# Patient Record
Sex: Male | Born: 1990 | Hispanic: Yes | Marital: Single | Smoking: Former smoker
Health system: Southern US, Community
[De-identification: ages and names within clinical notes are randomized; demographics above are authoritative.]

## PROBLEM LIST (undated history)

## (undated) DIAGNOSIS — R51 Headache: Secondary | ICD-10-CM

## (undated) DIAGNOSIS — K219 Gastro-esophageal reflux disease without esophagitis: Secondary | ICD-10-CM

## (undated) DIAGNOSIS — R040 Epistaxis: Secondary | ICD-10-CM

## (undated) DIAGNOSIS — J302 Other seasonal allergic rhinitis: Secondary | ICD-10-CM

---

## 2009-01-13 ENCOUNTER — Emergency Department (HOSPITAL_COMMUNITY): Admission: EM | Admit: 2009-01-13 | Discharge: 2009-01-13 | Payer: Self-pay | Admitting: Emergency Medicine

## 2011-01-29 ENCOUNTER — Other Ambulatory Visit: Payer: Self-pay | Admitting: Chiropractic Medicine

## 2011-01-29 DIAGNOSIS — M25562 Pain in left knee: Secondary | ICD-10-CM

## 2011-02-03 ENCOUNTER — Ambulatory Visit
Admission: RE | Admit: 2011-02-03 | Discharge: 2011-02-03 | Disposition: A | Discharge status: Home / Self Care | Payer: Self-pay | Source: Ambulatory Visit | Attending: Chiropractic Medicine | Admitting: Chiropractic Medicine

## 2011-02-03 DIAGNOSIS — M25562 Pain in left knee: Secondary | ICD-10-CM

## 2011-09-15 ENCOUNTER — Encounter (HOSPITAL_COMMUNITY)
Admission: RE | Admit: 2011-09-15 | Discharge: 2011-09-15 | Disposition: A | Discharge status: Home / Self Care | Payer: Self-pay | Source: Ambulatory Visit | Attending: Orthopedic Surgery | Admitting: Orthopedic Surgery

## 2011-09-15 ENCOUNTER — Encounter (HOSPITAL_COMMUNITY): Payer: Self-pay

## 2011-09-15 ENCOUNTER — Encounter (HOSPITAL_COMMUNITY): Payer: Self-pay | Admitting: Pharmacy Technician

## 2011-09-15 HISTORY — DX: Epistaxis: R04.0

## 2011-09-15 HISTORY — DX: Headache: R51

## 2011-09-15 HISTORY — DX: Other seasonal allergic rhinitis: J30.2

## 2011-09-15 HISTORY — DX: Gastro-esophageal reflux disease without esophagitis: K21.9

## 2011-09-15 LAB — BASIC METABOLIC PANEL
BUN: 17 mg/dL (ref 6–23)
CO2: 23 mEq/L (ref 19–32)
Calcium: 9.7 mg/dL (ref 8.4–10.5)
Chloride: 104 mEq/L (ref 96–112)
Creatinine, Ser: 0.6 mg/dL (ref 0.50–1.35)
GFR calc Af Amer: >90 mL/min (ref >90)

## 2011-09-15 LAB — CBC
HCT: 49.4 % (ref 39.0–52.0)
MCHC: 34.8 g/dL (ref 30.0–36.0)
MCV: 92.2 fL (ref 78.0–100.0)
Platelets: 197 10*3/uL (ref 150–400)
RDW: 12.4 % (ref 11.5–15.5)

## 2011-09-15 LAB — SURGICAL PCR SCREEN: MRSA, PCR: NEGATIVE

## 2011-09-15 NOTE — Progress Notes (Signed)
Left msg. At Dr. Ranell Patrick office to ask for re-order on OR consent for correct side- pt. Reports its the L knee

## 2011-09-15 NOTE — Pre-Procedure Instructions (Signed)
18 Christopher Dixon  09/15/2011   Your procedure is scheduled on:  09/25/2011  Report to [facility] at 5:30 AM.  Call this number if you have problems the morning of surgery: [phone]   Remember:   Do not eat food:After Midnight.  THURSDAY  May have clear liquids: up to 4 Hours before arrival.  NOTHING AFTER 1:30a.m.   Clear liquids include soda, tea, black coffee, apple or grape juice, broth.  Take these medicines the morning of surgery with A SIP OF WATER: NONE   Do not wear jewelry, make-up or nail polish.  Do not wear lotions, powders, or perfumes. You may wear deodorant.  Do not shave 48 hours prior to surgery. Men may shave face and neck.  Do not bring valuables to the hospital.  Contacts, dentures or bridgework may not be worn into surgery.  Leave suitcase in the car. After surgery it may be brought to your room.  For patients admitted to the hospital, checkout time is 11:00 AM the day of discharge.   Patients discharged the day of surgery will not be allowed to drive home.  Name and phone number of your driver: /w FRIEND  Special Instructions: CHG Shower Use Special Wash: 1/2 bottle night before surgery and 1/2 bottle morning of surgery.   Please read over the following fact sheets that you were given: Pain Booklet, Coughing and Deep Breathing, MRSA Information and Surgical Site Infection Prevention

## 2011-09-22 NOTE — H&P (Addendum)
CC: right knee pain HPI: 21 y/o male with a knee injury with complete ACL rupture, pt has elected for an ACL repair to increase function for the right knee PMH: GERD Social: non smoker, no ETOH, no PCP Allergies: NKDA Meds: tylenol motrin ROS: pain and instability of right knee with rom and activity Vitals: alert and appropriate 21 y/o male in no acute distress Right knee with full rom Positive lachmans and anterior drawer tests nv intact with no rashes or edema distally Normal heel toe gait X-rays: complete rupture of right acl Impression: right ACL tear Plan: acl reconstruction with hamstring autograft  The patient has a left knee ACL tear.  The history and physical on Sep 25, 2011 reveals and injured and unstable left knee.  Imaging reveals Left knee ACL tear and displaced bucket-handle medial meniscus tear.  PLan L knee ACL-R and possible meniscal repair  Malon Kindle, MD

## 2011-09-24 MED ORDER — CEFAZOLIN SODIUM 1-5 GM-% IV SOLN
1.0000 g | INTRAVENOUS | Status: AC
  Administered 2011-09-25 (×2): 1 g via INTRAVENOUS
  Filled 2011-09-24: qty 50

## 2011-09-24 MED ORDER — CHLORHEXIDINE GLUCONATE 4 % EX LIQD: 60.0000 mL | Freq: Once | CUTANEOUS | Status: DC

## 2011-09-24 MED ORDER — SODIUM CHLORIDE 0.9 % IV SOLN: INTRAVENOUS | Status: DC

## 2011-09-25 ENCOUNTER — Encounter (HOSPITAL_COMMUNITY): Payer: Self-pay | Admitting: Surgery

## 2011-09-25 ENCOUNTER — Encounter (HOSPITAL_COMMUNITY)
Admission: RE | Disposition: A | Discharge status: Home / Self Care | Payer: Self-pay | Source: Ambulatory Visit | Attending: Orthopedic Surgery

## 2011-09-25 ENCOUNTER — Encounter (HOSPITAL_COMMUNITY): Payer: Self-pay | Admitting: Certified Registered"

## 2011-09-25 ENCOUNTER — Ambulatory Visit (HOSPITAL_COMMUNITY): Payer: Self-pay | Admitting: Certified Registered"

## 2011-09-25 ENCOUNTER — Ambulatory Visit (HOSPITAL_COMMUNITY)
Admission: RE | Admit: 2011-09-25 | Discharge: 2011-09-25 | Disposition: A | Discharge status: Home / Self Care | Payer: Self-pay | Source: Ambulatory Visit | Attending: Orthopedic Surgery | Admitting: Orthopedic Surgery

## 2011-09-25 DIAGNOSIS — IMO0002 Reserved for concepts with insufficient information to code with codable children: Secondary | ICD-10-CM | POA: Insufficient documentation

## 2011-09-25 DIAGNOSIS — S83509A Sprain of unspecified cruciate ligament of unspecified knee, initial encounter: Secondary | ICD-10-CM | POA: Insufficient documentation

## 2011-09-25 DIAGNOSIS — R51 Headache: Secondary | ICD-10-CM | POA: Insufficient documentation

## 2011-09-25 DIAGNOSIS — Z01812 Encounter for preprocedural laboratory examination: Secondary | ICD-10-CM | POA: Insufficient documentation

## 2011-09-25 DIAGNOSIS — X500XXA Overexertion from strenuous movement or load, initial encounter: Secondary | ICD-10-CM | POA: Insufficient documentation

## 2011-09-25 HISTORY — PX: ANTERIOR CRUCIATE LIGAMENT REPAIR: SHX115

## 2011-09-25 SURGERY — RECONSTRUCTION, KNEE, ACL
Anesthesia: Regional | Site: Knee | Laterality: Left | Wound class: Clean

## 2011-09-25 MED ORDER — OXYCODONE-ACETAMINOPHEN 5-325 MG PO TABS: 1.0000 | ORAL_TABLET | ORAL | Status: AC | PRN

## 2011-09-25 MED ORDER — ONDANSETRON HCL 4 MG/2ML IJ SOLN
INTRAMUSCULAR | Status: DC | PRN
  Administered 2011-09-25: 4 mg via INTRAVENOUS

## 2011-09-25 MED ORDER — BUPIVACAINE-EPINEPHRINE PF 0.5-1:200000 % IJ SOLN
INTRAMUSCULAR | Status: DC | PRN
  Administered 2011-09-25: 30 mL

## 2011-09-25 MED ORDER — HYDROMORPHONE HCL PF 1 MG/ML IJ SOLN: 0.2500 mg | INTRAMUSCULAR | Status: DC | PRN

## 2011-09-25 MED ORDER — LIDOCAINE HCL (CARDIAC) 20 MG/ML IV SOLN
INTRAVENOUS | Status: DC | PRN
  Administered 2011-09-25: 80 mg via INTRAVENOUS

## 2011-09-25 MED ORDER — FENTANYL CITRATE 0.05 MG/ML IJ SOLN
INTRAMUSCULAR | Status: DC | PRN
  Administered 2011-09-25: 25 ug via INTRAVENOUS
  Administered 2011-09-25 (×3): 50 ug via INTRAVENOUS
  Administered 2011-09-25: 25 ug via INTRAVENOUS
  Administered 2011-09-25 (×2): 50 ug via INTRAVENOUS

## 2011-09-25 MED ORDER — PHENYLEPHRINE HCL 10 MG/ML IJ SOLN
INTRAMUSCULAR | Status: DC | PRN
  Administered 2011-09-25 (×2): 40 ug via INTRAVENOUS
  Administered 2011-09-25: 80 ug via INTRAVENOUS
  Administered 2011-09-25 (×4): 40 ug via INTRAVENOUS

## 2011-09-25 MED ORDER — LACTATED RINGERS IV SOLN
INTRAVENOUS | Status: DC | PRN
  Administered 2011-09-25 (×3): via INTRAVENOUS

## 2011-09-25 MED ORDER — ONDANSETRON HCL 4 MG/2ML IJ SOLN: 4.0000 mg | Freq: Four times a day (QID) | INTRAMUSCULAR | Status: DC | PRN

## 2011-09-25 MED ORDER — CEFAZOLIN SODIUM 1-5 GM-% IV SOLN
INTRAVENOUS | Status: AC
  Filled 2011-09-25: qty 50

## 2011-09-25 MED ORDER — BUPIVACAINE-EPINEPHRINE 0.25% -1:200000 IJ SOLN
INTRAMUSCULAR | Status: DC | PRN
  Administered 2011-09-25: 3 mL

## 2011-09-25 MED ORDER — MIDAZOLAM HCL 5 MG/5ML IJ SOLN
INTRAMUSCULAR | Status: DC | PRN
  Administered 2011-09-25: 2 mg via INTRAVENOUS

## 2011-09-25 MED ORDER — PROPOFOL 10 MG/ML IV EMUL
INTRAVENOUS | Status: DC | PRN
  Administered 2011-09-25: 200 mg via INTRAVENOUS

## 2011-09-25 MED ORDER — SODIUM CHLORIDE 0.9 % IR SOLN
Status: DC | PRN
  Administered 2011-09-25: 15000 mL

## 2011-09-25 MED ORDER — KETOROLAC TROMETHAMINE 30 MG/ML IJ SOLN
INTRAMUSCULAR | Status: DC | PRN
  Administered 2011-09-25: 30 mg via INTRAVENOUS

## 2011-09-25 MED ORDER — METHOCARBAMOL 500 MG PO TABS: 500.0000 mg | ORAL_TABLET | Freq: Three times a day (TID) | ORAL | Status: AC | PRN

## 2011-09-25 SURGICAL SUPPLY — 71 items
BANDAGE ELASTIC 4 VELCRO ST LF (GAUZE/BANDAGES/DRESSINGS) ×2 IMPLANT
BANDAGE ELASTIC 6 VELCRO ST LF (GAUZE/BANDAGES/DRESSINGS) ×2 IMPLANT
BANDAGE ESMARK 6X9 LF (GAUZE/BANDAGES/DRESSINGS) ×1 IMPLANT
BANDAGE GAUZE ELAST BULKY 4 IN (GAUZE/BANDAGES/DRESSINGS) ×4 IMPLANT
BIT DRILL CANN ENDO 4.5 STRL (BIT) ×2 IMPLANT
BLADE CUTTER GATOR 3.5 (BLADE) ×2 IMPLANT
BLADE GREAT WHITE 4.2 (BLADE) ×2 IMPLANT
BLADE SURG 10 STRL SS (BLADE) ×2 IMPLANT
BLADE SURG 15 STRL LF DISP TIS (BLADE) ×1 IMPLANT
BLADE SURG 15 STRL SS (BLADE) ×1
BNDG ESMARK 6X9 LF (GAUZE/BANDAGES/DRESSINGS) ×2
BUR OVAL 4.0 (BURR) ×2 IMPLANT
CLOTH BEACON ORANGE TIMEOUT ST (SAFETY) ×2 IMPLANT
COVER SURGICAL LIGHT HANDLE (MISCELLANEOUS) ×2 IMPLANT
CUFF TOURNIQUET SINGLE 34IN LL (TOURNIQUET CUFF) IMPLANT
CUFF TOURNIQUET SINGLE 44IN (TOURNIQUET CUFF) IMPLANT
DRAIN PENROSE 1/4X12 LTX STRL (WOUND CARE) ×2 IMPLANT
DRAPE ARTHROSCOPY W/POUCH 114 (DRAPES) ×2 IMPLANT
DRAPE INCISE IOBAN 66X45 STRL (DRAPES) ×2 IMPLANT
DRAPE U-SHAPE 47X51 STRL (DRAPES) ×2 IMPLANT
DRSG EMULSION OIL 3X3 NADH (GAUZE/BANDAGES/DRESSINGS) ×2 IMPLANT
DURAPREP 26ML APPLICATOR (WOUND CARE) ×2 IMPLANT
ELECT MENISCUS 165MM 90D (ELECTRODE) IMPLANT
ELECT REM PT RETURN 9FT ADLT (ELECTROSURGICAL) ×2
ELECTRODE REM PT RTRN 9FT ADLT (ELECTROSURGICAL) ×1 IMPLANT
ENDOBUTTON CL ULTRA 25MM ×2 IMPLANT
FIXATION ENDBTTN CL ULTR 25MM ×1 IMPLANT
GLOVE BIOGEL PI ORTHO PRO 7.5 (GLOVE) ×1
GLOVE BIOGEL PI ORTHO PRO SZ8 (GLOVE) ×1
GLOVE ORTHO TXT STRL SZ7.5 (GLOVE) ×2 IMPLANT
GLOVE PI ORTHO PRO STRL 7.5 (GLOVE) ×1 IMPLANT
GLOVE PI ORTHO PRO STRL SZ8 (GLOVE) ×1 IMPLANT
GLOVE SURG ORTHO 8.5 STRL (GLOVE) ×2 IMPLANT
GOWN STRL NON-REIN LRG LVL3 (GOWN DISPOSABLE) IMPLANT
GOWN STRL REIN XL XLG (GOWN DISPOSABLE) IMPLANT
KIT BASIN OR (CUSTOM PROCEDURE TRAY) ×2 IMPLANT
KIT ROOM TURNOVER OR (KITS) ×2 IMPLANT
KIT TRANSTIBIAL (DISPOSABLE) ×2 IMPLANT
KNIFE GRAFT ACL 10MM 5952 (MISCELLANEOUS) IMPLANT
MANIFOLD NEPTUNE II (INSTRUMENTS) ×2 IMPLANT
NS IRRIG 1000ML POUR BTL (IV SOLUTION) ×2 IMPLANT
PACK ARTHROSCOPY DSU (CUSTOM PROCEDURE TRAY) ×2 IMPLANT
PAD ARMBOARD 7.5X6 YLW CONV (MISCELLANEOUS) ×4 IMPLANT
PASSER SUT SWANSON 36MM LOOP (INSTRUMENTS) ×2 IMPLANT
PENCIL BUTTON HOLSTER BLD 10FT (ELECTRODE) ×2 IMPLANT
SCREW BIO DELTA 9MM (Screw) ×2 IMPLANT
SCREW LO PRO 25MM (Screw) ×2 IMPLANT
SET ARTHROSCOPY TUBING (MISCELLANEOUS) ×1
SET ARTHROSCOPY TUBING LN (MISCELLANEOUS) ×1 IMPLANT
SPONGE GAUZE 4X4 12PLY (GAUZE/BANDAGES/DRESSINGS) ×2 IMPLANT
SPONGE LAP 4X18 X RAY DECT (DISPOSABLE) ×4 IMPLANT
STRIP CLOSURE SKIN 1/2X4 (GAUZE/BANDAGES/DRESSINGS) ×4 IMPLANT
SUCTION FRAZIER TIP 10 FR DISP (SUCTIONS) ×2 IMPLANT
SUT ETHIBOND 5 LR DA (SUTURE) IMPLANT
SUT FIBERWIRE #2 38 T-5 BLUE (SUTURE) ×12
SUT MENISCAL KIT (KITS) IMPLANT
SUT MERSILENE 5MM BP 1 12 (SUTURE) ×4 IMPLANT
SUT MNCRL AB 4-0 PS2 18 (SUTURE) ×2 IMPLANT
SUT PDS AB 1 CTX 36 (SUTURE) IMPLANT
SUT VIC AB 0 CT1 27 (SUTURE) ×2
SUT VIC AB 0 CT1 27XBRD ANBCTR (SUTURE) ×2 IMPLANT
SUT VIC AB 2-0 CT1 27 (SUTURE) ×1
SUT VIC AB 2-0 CT1 TAPERPNT 27 (SUTURE) ×1 IMPLANT
SUTURE FIBERWR #2 38 T-5 BLUE (SUTURE) ×6 IMPLANT
SYR 20ML ECCENTRIC (SYRINGE) IMPLANT
SYR BULB IRRIGATION 50ML (SYRINGE) ×2 IMPLANT
TOWEL OR 17X24 6PK STRL BLUE (TOWEL DISPOSABLE) ×2 IMPLANT
TOWEL OR 17X26 10 PK STRL BLUE (TOWEL DISPOSABLE) ×2 IMPLANT
WAND 90 DEG TURBOVAC W/CORD (SURGICAL WAND) ×2 IMPLANT
WATER STERILE IRR 1000ML POUR (IV SOLUTION) ×2 IMPLANT
WRAP KNEE MAXI GEL POST OP (GAUZE/BANDAGES/DRESSINGS) IMPLANT

## 2011-09-25 NOTE — Anesthesia Procedure Notes (Addendum)
Anesthesia Regional Block:  Femoral nerve block  Pre-Anesthetic Checklist: ,, timeout performed, Correct Patient, Correct Site, Correct Laterality, Correct Procedure,, site marked, risks and benefits discussed, Surgical consent,  Pre-op evaluation,  At surgeon's request and post-op pain management  Laterality: Left  Prep: chloraprep       Needles:  Injection technique: Single-shot  Needle Type: Echogenic Stimulator Needle     Needle Length: 9cm  Needle Gauge: 21    Additional Needles:  Procedures: nerve stimulator Femoral nerve block  Nerve Stimulator or Paresthesia:  Response: Quadriceps muscle contraction, 0.45 mA,   Additional Responses:   Narrative:  Start time: 09/25/2011 7:10 AM End time: 09/25/2011 7:27 AM Injection made incrementally with aspirations every 5 mL.  Performed by: Personally  Anesthesiologist: Dr Chaney Malling  Additional Notes: Functioning IV was confirmed and monitors were applied.  A 90mm 21ga Arrow echogenic stimulator needle was used. Sterile prep and drape,hand hygiene and sterile gloves were used.  Negative aspiration and negative test dose prior to incremental administration of local anesthetic. The patient tolerated the procedure well.    Femoral nerve block

## 2011-09-25 NOTE — Interval H&P Note (Signed)
History and Physical Interval Note:  09/25/2011 7:39 AM  Christopher Dixon  has presented today for surgery, with the diagnosis of LEFT KNEE ACL TEAR  The various methods of treatment have been discussed with the patient and family. After consideration of risks, benefits and other options for treatment, the patient has consented to  Procedure(s) (LRB): RECONSTRUCTION ANTERIOR CRUCIATE LIGAMENT (ACL) (Left) as a surgical intervention .  The patients' history has been reviewed, patient examined, no change in status, stable for surgery.  I have reviewed the patients' chart and labs.  Questions were answered to the patient's satisfaction.     [name],STEVEN R

## 2011-09-25 NOTE — Anesthesia Preprocedure Evaluation (Signed)
Anesthesia Evaluation  Patient identified by MRN, date of birth, ID band Patient awake    Reviewed: Allergy & Precautions, H&P , NPO status , Patient's Chart, lab work & pertinent test results  Airway Mallampati: II  Neck ROM: full    Dental   Pulmonary          Cardiovascular     Neuro/Psych  Headaches,    GI/Hepatic GERD-  ,  Endo/Other    Renal/GU      Musculoskeletal   Abdominal   Peds  Hematology   Anesthesia Other Findings   Reproductive/Obstetrics                           Anesthesia Physical Anesthesia Plan  ASA: II  Anesthesia Plan: General and Regional   Post-op Pain Management: MAC Combined w/ Regional for Post-op pain   Induction: Intravenous  Airway Management Planned: LMA  Additional Equipment:   Intra-op Plan:   Post-operative Plan: Extubation in OR  Informed Consent: I have reviewed the patients History and Physical, chart, labs and discussed the procedure including the risks, benefits and alternatives for the proposed anesthesia with the patient or authorized representative who has indicated his/her understanding and acceptance.     Plan Discussed with: CRNA and Surgeon  Anesthesia Plan Comments:         Anesthesia Quick Evaluation

## 2011-09-25 NOTE — Preoperative (Signed)
Beta Blockers   Reason not to administer Beta Blockers:Not Applicable 

## 2011-09-25 NOTE — Brief Op Note (Signed)
09/25/2011  11:12 AM  PATIENT:  Christopher Dixon  21 y.o. male  PRE-OPERATIVE DIAGNOSIS:  Left KNEE ACL TEAR, medial meniscus tear  POST-OPERATIVE DIAGNOSIS:  left knee anterior cruciate ligament tear, medial meniscus tear  PROCEDURE:  Procedure(s) (LRB): LEFT KNEE ARTHROSCOPY WITH ARTHROSCOPIC acl RECONSTRUCTION ANTERIOR CRUCIATE LIGAMENT (ACL) (Left), PARTIAL MEDIAL MENISCECTOMY  SURGEON:  Surgeon(s) and Role:    * Verlee Rossetti, MD - Primary  PHYSICIAN ASSISTANT:   ASSISTANTS: Thea Gist, PA-C  ANESTHESIA:   general  EBL:  Total I/O In: 2000 [I.V.:2000] Out: -   BLOOD ADMINISTERED:none  DRAINS: none   LOCAL MEDICATIONS USED:  NONE  SPECIMEN:  No Specimen  DISPOSITION OF SPECIMEN:  N/A  COUNTS:  YES  TOURNIQUET:   Total Tourniquet Time Documented: Thigh (Left) - 23 minutes  DICTATION: .Other Dictation: Dictation Number [id number]  PLAN OF CARE: Discharge to home after PACU  PATIENT DISPOSITION:  PACU - hemodynamically stable.   Delay start of Pharmacological VTE agent (>24hrs) due to surgical blood loss or risk of bleeding: not applicable

## 2011-09-25 NOTE — Discharge Instructions (Signed)
Ice knee constantly!!! Do exercises constantly - ankle pumps, both ankles,  Heel slides in bed to bend the knee  Use Knee Brace while up walking and while asleep at night.Marland Kitchenotherwise can remove while seated and icing or exercising the knee  Do NOT prop anything behind the knee.  Ok to place a pillow under the ankle and foot to elevate the leg.  NO WEIGHT ON THE OPERATIVE LEFT LEG FOR ONE MONTH!!!  USE CRUTCHES  FOLLOW UP IN 2 weeks  [phone] Dr Norris\Instrucciones a seguir luego de la anestesia general en los adultos (Instructions Following General Anesthetic, Adult) Usted fue sometido a anestesia general. Un anestesista (un enfermero especializado en administrar anestesia) o un anestesilogo (un mdico especializado en administrar anestesia) lo ha inducido a dormir con Designer, multimedia un procedimiento. Durante las 24 horas siguientes al procedimiento podr sentir:  Research scientist (life sciences).   Debilidad.   Somnolencia.  DESPUS DE LA CIRUGA Despus de la [place], lo llevarn a una sala de recuperacin. Un enfermero controlar su evolucin. Una vez que despierte, se encuentre estabilizado y pueda ingerir lquidos, usted podr volver a su hogar excepto que ocurra un imprevisto. La informacin que sigue es vlida para las primeras 24 horas posteriores a Higher education careers adviser.  No conduzca. Si est solo, no tome transportes pblicos.   No beba alcohol.   No tome medicamentos que no le haya autorizado el profesional que lo asiste.   No firme documentos importantes ni tome decisiones trascendentes.   Es importante que una persona responsable lo acompae durante las primeras 24 horas luego de la anestesia.   Puede reanudar su dieta y sus actividades normales segn se le haya indicado.   Utilice los medicamentos de venta libre o de prescripcin para Chief Technology Officer, Environmental health practitioner o la [place], segn se lo indique el profesional que lo asiste.  Si tiene preguntas o se le presenta algn problema  relacionado con la anestesia, comunquese con el hospital y pida por el anestesista o anestesilogo de [place]. SOLICITE ATENCIN MDICA DE INMEDIATO SI:  Aparece una erupcin cutnea.   Presenta dificultad para respirar.   Siente dolor en el pecho.   Presenta algn problema de alergia.  Document Released: 04/20/2005 Document Revised: 04/09/2011 [facility] Patient Information 2012 [place], [place].

## 2011-09-25 NOTE — Anesthesia Postprocedure Evaluation (Signed)
Anesthesia Post Note  Patient: Christopher Dixon  Procedure(s) Performed: Procedure(s) (LRB): RECONSTRUCTION ANTERIOR CRUCIATE LIGAMENT (ACL) (Left)  Anesthesia type: General  Patient location: PACU  Post pain: Pain level controlled and Adequate analgesia  Post assessment: Post-op Vital signs reviewed, Patient's Cardiovascular Status Stable, Respiratory Function Stable, Patent Airway and Pain level controlled  Last Vitals:  Filed Vitals:   09/25/11 1232  BP: 140/75  Pulse: 85  Temp: 36.2 C  Resp: 15    Post vital signs: Reviewed and stable  Level of consciousness: awake, alert  and oriented  Complications: No apparent anesthesia complications

## 2011-09-25 NOTE — Transfer of Care (Signed)
Immediate Anesthesia Transfer of Care Note  Patient: Christopher Dixon  Procedure(s) Performed: Procedure(s) (LRB): RECONSTRUCTION ANTERIOR CRUCIATE LIGAMENT (ACL) (Left)  Patient Location: PACU  Anesthesia Type: General  Level of Consciousness: awake, alert  and oriented  Airway & Oxygen Therapy: Patient Spontanous Breathing and Patient connected to nasal cannula oxygen  Post-op Assessment: Report given to PACU RN, Post -op Vital signs reviewed and stable and Patient moving all extremities  Post vital signs: Reviewed and stable  Complications: No apparent anesthesia complications

## 2011-09-25 NOTE — Progress Notes (Signed)
Orthopedic Tech Progress Note Patient Details:  Christopher Dixon June 17, 1990 [id number]  Other Ortho Devices Type of Ortho Device: Crutches Ortho Device Interventions: Adjustment   Cammer, Mickie Bail 09/25/2011, 12:29 PM

## 2011-09-25 NOTE — H&P (Signed)
Christopher Dixon, interpreter from [place] at bedside interpreting all information to patient.//L. Kerry Odonohue,RN

## 2011-09-26 NOTE — Op Note (Signed)
[name], Dixon           ACCOUNT NO.:  [id number]  MEDICAL RECORD NO.:  [id number]  LOCATION:  [facility]                         FACILITY:  [facility]  PHYSICIAN:  Almedia Balls. Ranell Patrick, M.D. DATE OF BIRTH:  1990-11-02  DATE OF PROCEDURE:  09/25/2011 DATE OF DISCHARGE:  09/25/2011                              OPERATIVE REPORT   PREOPERATIVE DIAGNOSIS:  Left knee ACL tear and displaced bucket-handle medial meniscus tear.  POSTOPERATIVE DIAGNOSIS:  Left knee ACL tear and displaced bucket-handle medial meniscus tear as well as medial femoral condyle chondromalacia grade 3.  PROCEDURE PERFORMED:  Left knee arthroscopy with arthroscopic partial medial meniscectomy.  Arthroscopic hamstring autograft ACL reconstruction, and chondroplasty medial femoral condyle.  ATTENDING SURGEON:  Almedia Balls. Ranell Patrick, M.D.  ASSISTANT:  Donnie Coffin. Dixon, P.A., was scrubbed the entire procedure necessary for satisfactory completion of procedure and graft preparation, adequate visualization.  ANESTHESIA:  General anesthesia plus femoral block.  ESTIMATED BLOOD LOSS:  Minimal.  FLUID REPLACEMENT:  1200 mL crystalloid.  COUNTS:  Correct.  COMPLICATIONS:  No complications.  Perioperative antibiotics were given.  INDICATIONS:  Patient is a 21 year old male who suffered a twisting injury to his left knee.  The patient has been ACL deficient for the past 6-9 months with a displaced bucket-handle meniscal tear.  He is presenting now for reconstruction and assessment and potential repair versus debridement of this meniscus.  Informed consent obtained.  DESCRIPTION OF PROCEDURE:  After adequate level of anesthesia was achieved, the patient was positioned supine in the operating table. Left knee was correctly identified and examined under anesthesia.  Time- out was called.  We identified full extension, flexion to about 120 degrees.  A unstable knee with positive Lachman's, positive anterior drawer, positive  pivot shift, negative posterior drawer, negative posterior lateral rotatory instability in varus valgus stress was stable.  After exam under anesthesia, we placed a nonsterile tourniquet on left proximal thigh.  Left leg placed in arthroscopic leg holder. Right leg placed in a leg holder.  Sterilely prepped and draped the left knee.  We exsanguinated the limb using an Esmarch bandage.  We made a longitudinal skin incision over the pes anserine area.  Dissection down through subcutaneous tissues using Bovie, identified the sartorius fascia, divided exposing the gracilis and semitendinosus tendons.  We went ahead and harvested this using closed-end tendon strippers, whip stitching each end with #2 FiberWire suture.  These tendons were taken on the back table and placed on a master graft preparation device to keep tension on them.  We measured the size of tibia 8 mm.  At this point, we went ahead and identified, we concluded the tourniquet at 22 minutes and deflated that, placed compressive bandage over the incision. We then made our 3 standard incisions for the arthroscopy, superolateral outflow, anterolateral scope and anteromedial working portals.  We identified normal patellofemoral articular cartilage.  Multiple loose bodies were evacuated.  Medial compartment entered, there was weightbearing surface, medial femoral condyle 2 x 2 cm partial-thickness cartilage loss with loose flaps and fibrillation, performed gentle chondroplasty not to bleeding bone throughout that area.  The medial meniscus was displaced into the notch.  It was quite beat up, basically was extremely degenerative  towards its posterior attachment and frayed and delaminated.  We went ahead and decided at this time this was not amenable to repair.  We went ahead and completed the meniscectomy using basket forceps and motorized shaver.  Pituitary rongeur, removing the battered medial meniscal bucket-handle portion.  There  was probably about 30 to 40% meniscus remaining in meniscal rim that was preserved, hoop stresses preserved.  The ACL was absent, PCL was intact.  Lateral compartment was normal with no signs of articular cartilage or meniscal damage.  At this point, we went ahead and did a notch plasty.  We removed the ACL stump and placed a 45-degree Lumatec guide in place, drilling into the knee coming from the pes anserine fairly medial on the tibia to get the right angle entering just anterior the PCL.  At this point, we tried it, initially drilled the femoral tunnel through the tibial tunnel, but this could not get to the appropriate, this was a very deep notch and despite aggressive burring to open that up, we just could not get to the proper the top positions.  We made decision to go and drill through the medial portal.  We hyperflexed the knee, went and drilled the posterior femur using the 6 mm offset guide and putting that in about the 130-145 position on the knee.  We drilled out the anterolateral thigh with the Beath pin.  We then over drilled with the 8 mm acorn drill bit as we had on the tibia.  We rasped smooth the entrances.  We used the shaver to remove any loose debris and bone.  We next went ahead and we drilled the socket about 20-25 mm thick.  We then drilled with a 5 mm drill bit out of the lateral cortex through the Endo button.  We then went ahead and did about a 20 mm EndoButton with Mersilene tape, tied, we put the graft around that suture 2-0 Vicryl to hold it in place.  We labeled this semitendinosus and gracilis tendons separately for independent tensioning.  We passed the graft without complication through the tibial tunnel, through the femur, we then flipped the button.  We then verified the button externally deployed on the femur with C-arm.  We then ranged the knee about 30 times to remove cramp with good pole tensioning independently.  We then placed a medial full nail  near full extension with a good posterior drawer applied and placed a 9 x 35 delta interference screw or BioScrew,  adjacent the head quadrupled as hamstring graft at the tibia.  Next, we went ahead and placed a size 25 mm low profile screw post in the tibia distal to the opening of the tibial tunnel.  We tied our sutures independently around that.  With that done, we went ahead and ranged the knee.  We had no restriction in range of motion, nice and stable, both Lachman and drawer, no difference in stability and flexion, extension.  Nice isometric graft, very very good, horizontal location at 130 to 145, as we looked back in the knee and the graft under nice nice tension and I am very pleased with the reconstruction at this point, we thoroughly irrigated the wounds, especially the tibial wound and then we closed with layered closure.  Vicryl and Monocryl, and Monocryl for portals. Sterile compressive bandage and knee immobilizer applied.  Patient tolerated surgery well.     Almedia Balls. Ranell Patrick, M.D.     SRN/MEDQ  D:  09/25/2011  T:  09/25/2011  Job:  [id number]

## 2011-09-30 ENCOUNTER — Encounter (HOSPITAL_COMMUNITY): Payer: Self-pay | Admitting: Orthopedic Surgery

## 2011-11-12 ENCOUNTER — Ambulatory Visit: Payer: Self-pay

## 2011-11-19 ENCOUNTER — Ambulatory Visit: Payer: Self-pay | Attending: Orthopedic Surgery

## 2011-11-19 DIAGNOSIS — R5381 Other malaise: Secondary | ICD-10-CM | POA: Insufficient documentation

## 2011-11-19 DIAGNOSIS — R269 Unspecified abnormalities of gait and mobility: Secondary | ICD-10-CM | POA: Insufficient documentation

## 2011-11-19 DIAGNOSIS — M25569 Pain in unspecified knee: Secondary | ICD-10-CM | POA: Insufficient documentation

## 2011-11-19 DIAGNOSIS — M6281 Muscle weakness (generalized): Secondary | ICD-10-CM | POA: Insufficient documentation

## 2011-11-19 DIAGNOSIS — IMO0001 Reserved for inherently not codable concepts without codable children: Secondary | ICD-10-CM | POA: Insufficient documentation

## 2011-11-25 ENCOUNTER — Ambulatory Visit: Payer: Self-pay

## 2011-11-27 ENCOUNTER — Ambulatory Visit: Payer: Self-pay

## 2011-12-01 ENCOUNTER — Ambulatory Visit: Payer: Self-pay | Admitting: Physical Therapy

## 2011-12-04 ENCOUNTER — Ambulatory Visit: Payer: Self-pay | Attending: Orthopedic Surgery

## 2011-12-04 DIAGNOSIS — M6281 Muscle weakness (generalized): Secondary | ICD-10-CM | POA: Insufficient documentation

## 2011-12-04 DIAGNOSIS — M25569 Pain in unspecified knee: Secondary | ICD-10-CM | POA: Insufficient documentation

## 2011-12-04 DIAGNOSIS — IMO0001 Reserved for inherently not codable concepts without codable children: Secondary | ICD-10-CM | POA: Insufficient documentation

## 2011-12-04 DIAGNOSIS — R269 Unspecified abnormalities of gait and mobility: Secondary | ICD-10-CM | POA: Insufficient documentation

## 2011-12-04 DIAGNOSIS — R5381 Other malaise: Secondary | ICD-10-CM | POA: Insufficient documentation

## 2011-12-08 ENCOUNTER — Ambulatory Visit: Payer: Self-pay | Admitting: Physical Therapy

## 2011-12-11 ENCOUNTER — Ambulatory Visit: Payer: Self-pay

## 2011-12-15 ENCOUNTER — Ambulatory Visit: Payer: Self-pay | Admitting: Physical Therapy

## 2011-12-18 ENCOUNTER — Ambulatory Visit: Payer: Self-pay

## 2015-07-13 ENCOUNTER — Ambulatory Visit (INDEPENDENT_AMBULATORY_CARE_PROVIDER_SITE_OTHER): Payer: Self-pay | Admitting: Family Medicine

## 2015-07-13 VITALS — BP 130/78 | HR 70 | Temp 98.2°F | Resp 17 | Ht 66.0 in | Wt 169.0 lb

## 2015-07-13 DIAGNOSIS — K3189 Other diseases of stomach and duodenum: Secondary | ICD-10-CM

## 2015-07-13 NOTE — Progress Notes (Signed)
This is a 25 year old Hispanic man who presents with 2 months of reflux type symptoms: Acid taste in his mouth with morning vomiting. He's tried taking milk but no other approaches have been tried.  Patient says that food generally makes his stomach cramps and pain go away.  Patient comes in with his significant other. He works at [employer].  Objective:BP 130/78 mmHg  Pulse 70  Temp(Src) 98.2 F (36.8 C) (Oral)  Resp 17  Ht 5\' 6"  (1.676 m)  Wt 169 lb (76.658 kg)  BMI 27.29 kg/m2  SpO2 98% HEENT: Normal Chest: Clear Heart: Regular no murmur Abdomen: Soft nontender without HSM, normal bowel sounds Skin: Unremarkable  Assessment: Hyperacidity syndrome with stress.  Plan: Nexium samples given to be taken 1 daily. Patient told to avoid taking too much milk. He's to return if symptoms aren't improving several days.  Signed, Sheila OatsKurt Jaliya Siegmann M.D.

## 2015-08-14 ENCOUNTER — Ambulatory Visit (INDEPENDENT_AMBULATORY_CARE_PROVIDER_SITE_OTHER): Payer: Self-pay | Admitting: Physician Assistant

## 2015-08-14 VITALS — BP 130/82 | HR 72 | Temp 97.7°F | Resp 14 | Ht 66.0 in | Wt 161.0 lb

## 2015-08-14 DIAGNOSIS — M545 Low back pain, unspecified: Secondary | ICD-10-CM

## 2015-08-14 MED ORDER — NAPROXEN 500 MG PO TABS: 500.0000 mg | ORAL_TABLET | Freq: Two times a day (BID) | ORAL | Status: DC

## 2015-08-14 MED ORDER — CYCLOBENZAPRINE HCL 10 MG PO TABS: 5.0000 mg | ORAL_TABLET | Freq: Three times a day (TID) | ORAL | Status: DC | PRN

## 2015-08-14 NOTE — Progress Notes (Signed)
08/14/2015 11:10 AM   DOB: 08/09/1990 / MRN: [id number]  SUBJECTIVE:  Christopher Dixon is a 25 y.o. male presenting for right sided back pain that started 4 days ago and is worsening. He complains that the pain radiates to his abdomen. His pain is worse with movement, particularly bending over and going from seated to standing, and states that his abdominal pain is worse with these movements as well. He denies nausea, emesis, and fever. He has not tried anything for his symptoms. Denies leg pain, leg weakness and numbness. No change in bowel and bladder. No dysuria, urgency, frequency.    He is allergic to beef-derived products and pork-derived products.   He  has a past medical history of Headache(784.0); Bleeding nose; GERD (gastroesophageal reflux disease); and Seasonal allergies.    He  reports that he quit smoking about 4 years ago. He does not have any smokeless tobacco history on file. He reports that he does not drink alcohol or use illicit drugs. He  has no sexual activity history on file. The patient  has past surgical history that includes Anterior cruciate ligament repair (09/25/2011).  His family history is negative for Anesthesia problems.  ROS  Per HPI.   Problem list and medications reviewed and updated by myself where necessary, and exist elsewhere in the encounter.   OBJECTIVE:  BP 130/82 mmHg  Pulse 72  Temp(Src) 97.7 F (36.5 C) (Oral)  Resp 14  Ht  (1.676 m)  Wt 161 lb (73.029 kg)  BMI 26.00 kg/m2  SpO2 97%  Physical Exam   Constitutional: He is oriented to person, place, and time. He appears well-developed. He does not appear ill.  Eyes: Conjunctivae and EOM are normal. Pupils are equal, round, and reactive to light.  Cardiovascular: Normal rate.  Pulmonary/Chest: Effort normal.  Abdominal: He exhibits no distension.  Musculoskeletal: Normal range of motion.  Lumbar back: He exhibits tenderness and spasm. He exhibits normal range of motion, no  bony tenderness, no edema and no deformity.  Back:  Neurological: He is alert and oriented to person, place, and time. He has normal strength. No cranial nerve deficit or sensory deficit. Coordination and gait normal. GCS eye subscore is 4. GCS verbal subscore is 5. GCS motor subscore is 6.  Reflex Scores:  Patellar reflexes are 2+ on the right side and 2+ on the left side.  Achilles reflexes are 2+ on the right side and 2+ on the left side.  Skin: Skin is warm and dry. No rash noted. He is not diaphoretic.  Psychiatric: He has a normal mood and affect.  Nursing note and vitals reviewed.     No results found for this or any previous visit (from the past 72 hour(s)).  No results found.  ASSESSMENT AND PLAN  Talbert was seen today for back pain and abdominal pain.  Diagnoses and all orders for this visit:  Right-sided low back pain without sciatica: Exam reassuring.  Educated him on the normal course of back pain.  Will treat symptomatically for now.  Advised if his pain goes longer than 3 weeks to RTC for further work up.  -     naproxen (NAPROSYN) 500 MG tablet; Take 1 tablet (500 mg total) by mouth 2 (two) times daily with a meal. -     cyclobenzaprine (FLEXERIL) 10 MG tablet; Take 0.5-1 tablets (5-10 mg total) by mouth 3 (three) times daily as needed for muscle spasms.    The patient was advised to call  or return to clinic if he does not see an improvement in symptoms or to seek the care of the closest emergency department if he worsens with the above plan.   Deliah BostonMichael Austine Wiedeman, MHS, PA-C [facility] 08/14/2015 11:10 AM

## 2015-08-14 NOTE — Progress Notes (Signed)
   Physical Exam  Constitutional: He is oriented to person, place, and time. He appears well-developed. He does not appear ill.  Eyes: Conjunctivae and EOM are normal. Pupils are equal, round, and reactive to light.  Cardiovascular: Normal rate.   Pulmonary/Chest: Effort normal.  Abdominal: He exhibits no distension.  Musculoskeletal: Normal range of motion.       Lumbar back: He exhibits tenderness and spasm. He exhibits normal range of motion, no bony tenderness, no edema and no deformity.       Back:  Neurological: He is alert and oriented to person, place, and time. He has normal strength. No cranial nerve deficit or sensory deficit. Coordination and gait normal. GCS eye subscore is 4. GCS verbal subscore is 5. GCS motor subscore is 6.  Reflex Scores:      Patellar reflexes are 2+ on the right side and 2+ on the left side.      Achilles reflexes are 2+ on the right side and 2+ on the left side. Skin: Skin is warm and dry. No rash noted. He is not diaphoretic.  Psychiatric: He has a normal mood and affect.  Nursing note and vitals reviewed.

## 2015-08-14 NOTE — Progress Notes (Deleted)
   08/14/2015 10:36 AM   DOB: 1990/10/13 / MRN: [id number]  SUBJECTIVE:  Christopher Dixon is a 25 y.o. male presenting for   He is allergic to beef-derived products and pork-derived products.   He  has a past medical history of Headache(784.0); Bleeding nose; GERD (gastroesophageal reflux disease); and Seasonal allergies.    He  reports that he quit smoking about 4 years ago. He does not have any smokeless tobacco history on file. He reports that he does not drink alcohol or use illicit drugs. He  has no sexual activity history on file. The patient  has past surgical history that includes Anterior cruciate ligament repair (09/25/2011).  His family history is negative for Anesthesia problems.  ROS  Problem list and medications reviewed and updated by myself where necessary, and exist elsewhere in the encounter.   OBJECTIVE:  BP 130/82 mmHg  Pulse 72  Temp(Src) 97.7 F (36.5 C) (Oral)  Resp 14  Ht 5\' 6"  (1.676 m)  Wt 161 lb (73.029 kg)  BMI 26.00 kg/m2  SpO2 97%  Physical Exam  No results found for this or any previous visit (from the past 72 hour(s)).  No results found.  ASSESSMENT AND PLAN  There are no diagnoses linked to this encounter.  The patient was advised to call or return to clinic if he does not see an improvement in symptoms or to seek the care of the closest emergency department if he worsens with the above plan.   Deliah BostonMichael Cai Flott, MHS, PA-C [facility] 08/14/2015 10:36 AM

## 2015-08-14 NOTE — Patient Instructions (Addendum)
Dolor de espalda en adultos (Back Pain, Adult) El dolor de espalda es muy frecuente en los adultos.La causa del dolor de espalda es rara vez peligrosa y Conservation officer, historic buildings a menudo mejora con el [place].Es posible que se desconozca la causa de esta afeccin. Algunas causas comunes son las siguientes:  Distensin de los msculos o ligamentos que sostienen la columna vertebral.  Holiday representative (degeneracin) de los discos vertebrales.  Artritis.  Lesiones directas en la espalda. En [employer], el dolor de espalda es recurrente. Como rara vez es peligroso, las personas pueden aprender a Holiday representative afeccin por s mismas. INSTRUCCIONES PARA EL CUIDADO EN EL HOGAR Controle su dolor de espalda a fin de Recruitment consultant cambio. Las siguientes indicaciones ayudarn a Chief Strategy Officer que pueda sentir:  IT consultant. Si permanece sentado o de pie en un mismo lugar durante mucho tiempo, se tensiona la espalda. No se siente, conduzca o permanezca de pie en un mismo lugar durante ms de 30 minutos seguidos. Realice caminatas cortas en superficies planas tan pronto como le sea posible.Trate de caminar un poco ms de Publishing copy.  Haga ejercicio regularmente como se lo haya indicado el mdico. El ejercicio ayuda a que su espalda se cure ms rpidamente. Tambin ayuda a prevenir futuras lesiones al [employer] fuertes y flexibles.  No permanezca en la cama.Si hace reposo ms de 1 a 2 das, puede demorar su recuperacin.  Preste atencin a su cuerpo al inclinarse y levantarse. Las posiciones ms cmodas son las que ejercen menos tensin en la espalda en recuperacin. Siempre use tcnicas apropiadas para levantar objetos, como por ejemplo:  Flexionar las rodillas.  Mantener la carga cerca del cuerpo.  No torcerse.  Encuentre una posicin cmoda para dormir. Use un colchn firme y recustese de costado con las rodillas ligeramente flexionadas. Si se recuesta [employer], coloque  una almohada debajo de las rodillas.  Evite sentir ansiedad o estrs.El estrs aumenta la tensin muscular y puede empeorar el dolor de espalda.Es importante reconocer si se siente ansioso o estresado y aprender maneras de controlarlo, por ejemplo haciendo ejercicio.  Tome los medicamentos solamente como se lo haya indicado el mdico. Los medicamentos de venta libre para Best boy y la inflamacin a menudo son los ms eficaces.El mdico puede recetarle relajantes musculares.Estos medicamentos ayudan a Glass blower/designer de modo que pueda reanudar ms rpidamente sus actividades normales y el ejercicio saludable.  Aplique hielo sobre la zona lesionada.  Ponga el hielo en una bolsa plstica.  Coloque una toalla entre la piel y la bolsa de hielo.  Deje el hielo durante 43minutos, 2 a 3veces por da, durante los primeros 2 o 3das. Despus de eso, puede alternar el hielo y el calor para reducir Conservation officer, historic buildings y los espasmos.  Mantenga un peso saludable. El exceso de peso ejerce presin adicional sobre la espalda y hace que resulte difcil mantener una buena [place]. SOLICITE ATENCIN MDICA SI:  Siente un dolor que no se alivia con reposo o medicamentos.  Siente mucho dolor que se extiende a las piernas o los glteos.  El dolor no mejora en una semana.  Siente dolor por la noche.  Pierde peso.  Siente escalofros o fiebre. SOLICITE ATENCIN MDICA DE INMEDIATO SI:   Tiene nuevos problemas para controlar la vejiga o los intestinos.  Siente debilidad o adormecimiento inusuales en los brazos o en las piernas.  Siente nuseas o vmitos.  Siente dolor abdominal.  Siente que va a desmayarse.  Esta informacin no tiene como fin reemplazar el consejo del mdico. Asegrese de hacerle al mdico cualquier pregunta que tenga.   Document Released: 04/20/2005 Document Revised: 05/11/2014 Elsevier Interactive Patient Education 2016 Elsevier Inc.     IF you received an x-ray today,  you will receive an invoice from [facility]. Please contact [facility] at [phone] with questions or concerns regarding your invoice.   IF you received labwork today, you will receive an invoice from [facility]. Please contact [facility] at [phone] with questions or concerns regarding your invoice.   Our billing staff will not be able to assist you with questions regarding bills from these companies.  You will be contacted with the lab results as soon as they are available. The fastest way to get your results is to activate your My Chart account. Instructions are located on the last page of this paperwork. If you have not heard from us regarding the results in 2 weeks, please contact this office.     

## 2016-01-22 ENCOUNTER — Ambulatory Visit (INDEPENDENT_AMBULATORY_CARE_PROVIDER_SITE_OTHER): Payer: Self-pay | Admitting: Physician Assistant

## 2016-01-22 ENCOUNTER — Ambulatory Visit (INDEPENDENT_AMBULATORY_CARE_PROVIDER_SITE_OTHER): Payer: Self-pay

## 2016-01-22 DIAGNOSIS — M79662 Pain in left lower leg: Secondary | ICD-10-CM

## 2016-01-22 DIAGNOSIS — M25562 Pain in left knee: Secondary | ICD-10-CM

## 2016-01-22 MED ORDER — CYCLOBENZAPRINE HCL 5 MG PO TABS: 5.0000 mg | ORAL_TABLET | Freq: Three times a day (TID) | ORAL | 0 refills | Status: DC | PRN

## 2016-01-22 MED ORDER — NAPROXEN 500 MG PO TABS: 500.0000 mg | ORAL_TABLET | Freq: Two times a day (BID) | ORAL | 0 refills | Status: DC
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
## 2016-01-22 NOTE — Patient Instructions (Addendum)
Aplicar P.R.I.C.E. principios (Resto, Hielo, Compresin, Elevacin) a la rodilla lesionada. Descanse de la formacin o de cualquier actividad o movimiento que sea doloroso para permitir que la curacin tenga lugar Aplique envoltura de terapia de hielo o fro durante 10 a 15 minutos cada hora, reduciendo inicialmente la frecuencia segn lo permitan los sntomas. El hielo no debe aplicarse directamente a la piel, pero use una toalla de t hmeda o similar. Especialista fro terapia rodilla envuelve son convenientes de usar y Press photographeraplicar la compresin tambin. Use un vendaje de compresin o apoyo de rodilla para ayudar a reducir cualquier hinchazn y proect la articulacin.  Tome naproxeno diariamente durante las Navistar International Corporationprximas dos semanas. Use hielo diariamente hasta que el dolor disminuya. Regresar si los sntomas no mejoran en 7-10 das.  IF you received an x-ray today, you will receive an invoice from [facility]. Please contact [facility] at [phone] with questions or concerns regarding your invoice.   IF you received labwork today, you will receive an invoice from [facility]. Please contact [facility] at [phone] with questions or concerns regarding your invoice.   Our billing staff will not be able to assist you with questions regarding bills from these companies.  You will be contacted with the lab results as soon as they are available. The fastest way to get your results is to activate your My Chart account. Instructions are located on the last page of this paperwork. If you have not heard from us regarding the results in 2 weeks, please contact this office.

## 2016-08-14 ENCOUNTER — Ambulatory Visit (INDEPENDENT_AMBULATORY_CARE_PROVIDER_SITE_OTHER): Payer: Self-pay | Admitting: Family Medicine

## 2016-08-14 VITALS — BP 141/83 | HR 82 | Temp 98.9°F | Resp 17 | Ht 66.5 in | Wt 161.0 lb

## 2016-08-14 DIAGNOSIS — N481 Balanitis: Secondary | ICD-10-CM

## 2016-08-14 MED ORDER — CLOTRIMAZOLE 1 % EX CREA: 1.0000 "application " | TOPICAL_CREAM | Freq: Two times a day (BID) | CUTANEOUS | 0 refills | Status: AC

## 2016-08-14 NOTE — Progress Notes (Signed)
Subjective:  By signing my name below, I, Christopher Dixon, attest that this documentation has been prepared under the direction and in the presence of Christopher Flood, MD Electronically Signed: Charline Dixon, ED Scribe 08/14/2016 at 6:35 PM.   Patient ID: Christopher Dixon, male    DOB: 1990-11-07, 26 y.o.   MRN: [id number]  Chief Complaint  Patient presents with  . patient thinks he has a yeast infection   HPI Christopher Dixon is a 26 y.o. male who presents to Primary Care at [facility] complaining of pruritis in the genitalia for the past 2 days. Pt states that his girlfriend was seen in the office 3 days ago with similar symptoms and was diagnosed with a yeast infection. He has noticed a rash in the genitalia as well. Pt denies difficulty urinating. He also denies being with any other sexual partners other than his girlfriend. No h/o STIs.  There are no active problems to display for this patient.  Past Medical History:  Diagnosis Date  . Bleeding nose    last one 6 months ago- controlled spont.   Marland Kitchen GERD (gastroesophageal reflux disease)    pt. reports some gastritis - no med. currently  . Headache(784.0)   . Seasonal allergies    pt. reports little throat irritation, sneezing on & off    Past Surgical History:  Procedure Laterality Date  . ANTERIOR CRUCIATE LIGAMENT REPAIR  09/25/2011   Procedure: RECONSTRUCTION ANTERIOR CRUCIATE LIGAMENT (ACL);  Surgeon: Verlee Rossetti, MD;  Location: [facility] OR;  Service: Orthopedics;  Laterality: Left;      Allergies  Allergen Reactions  . Beef-Derived Products   . Pork-Derived Products Rash   Prior to Admission medications   Not on File   Social History   Social History  . Marital status: Single    Spouse name: N/A  . Number of children: N/A  . Years of education: N/A   Occupational History  . Not on file.   Social History Main Topics  . Smoking status: Former Smoker    Quit date: 05/18/2011  . Smokeless tobacco: Never Used  .  Alcohol use No  . Drug use: No  . Sexual activity: No   Other Topics Concern  . Not on file   Social History Narrative  . No narrative on file   Review of Systems  Genitourinary: Negative for difficulty urinating.  Skin: Positive for rash (genitalia).      Objective:   Physical Exam  Constitutional: He is oriented to person, place, and time. He appears well-developed and well-nourished. No distress.  HENT:  Head: Normocephalic and atraumatic.  Eyes: Conjunctivae and EOM are normal.  Neck: Neck supple. No tracheal deviation present.  Cardiovascular: Normal rate.   Pulmonary/Chest: Effort normal. No respiratory distress.  Genitourinary: Uncircumcised.  Genitourinary Comments: Erythema of slight dry excoriated skin. Distal penile shaft and at the base of head of penis. Slight erythema on scrotal skin. No swelling of foreskin.  Musculoskeletal: Normal range of motion.  Neurological: He is alert and oriented to person, place, and time.  Skin: Skin is warm and dry.  Psychiatric: He has a normal mood and affect. His behavior is normal.  Nursing note and vitals reviewed.  Vitals:   08/14/16 1719  BP: (!) 141/83  Pulse: 82  Resp: 17  Temp: 98.9 F (37.2 C)  TempSrc: Oral  SpO2: 97%  Weight: 161 lb (73 kg)  Height: 5' 6.5" (1.689 m)      Assessment & Plan:  Christopher Dixon is a 26 y.o. male Balanitis - Plan: clotrimazole (LOTRIMIN) 1 % cream  - suspected yeast/candidal infection with partner with same. Typical candidal appearance.   - clotrimazole top BID, then if not improving in next week return for possible testing. rtc precautions.   Meds ordered this encounter  Medications  . clotrimazole (LOTRIMIN) 1 % cream    Sig: Apply 1 application topically 2 (two) times daily.    Dispense:  30 g    Refill:  0   Patient Instructions  Apply to affected area twice per day for the next week to 10 days. Make sure to use that cream for at least 3-4 days after symptoms  resolve. If not improving within the next 1 week, return for testing as discussed. Sooner if worse.   Balanitis (Balanitis) La balanitis es la inflamacin de la cabeza del pene (glande). CAUSAS Puede tener mltiples causas, tanto infecciosas como no infecciosas. Con frecuencia, la balanitis es el resultado de una higiene personal deficiente, especialmente en los hombres no circuncidados. Sin una higiene [place], los virus, las bacterias y los hongos se acumulan entre el prepucio y el glande. Esto puede ocasionar una infeccin. La falta de aire y la irritacin debido a la secrecin normal llamada esmegma contribuyen al problema en los hombres no circuncidados. Otras causas son:  Irritacin qumica por el uso de algunos jabones y geles de ducha (especialmente jabones con perfume), condones, lubricantes personales, vaselina, espermicidas y acondicionadores de telas.  Enfermedades de la piel, como el eczema, la dermatitis y la psoriasis.  Alergias a algunos frmacos, como tetraciclina y sulfas.  Algunas enfermedades como la cirrosis heptica, insuficiencia cardaca congestiva y enfermedades renales.  Obesidad mrbida. FACTORES DE RIESGO  Diabetes mellitus.  Un prepucio apretado que es difcil de tirar hacia atrs hasta pasar el glande (fimosis).  Tener relaciones sexuales sin usar un condn. Christopher Dixon SNTOMAS Los sntomas pueden ser:  Secrecin que proviene de la zona debajo del prepucio.  Sensibilidad.  Picazn e imposibilidad para Landscape architect ereccin (debido al dolor).  Irritacin y erupcin cutnea.  Llagas en el glande y el prepucio. DIAGNSTICO El diagnstico de balanitis se realiza a travs de un examen fsico. TRATAMIENTO El tratamiento se basa en la causa. El tratamiento puede incluir:  Lavados frecuentes.  Mantener el glande y el prepucio secos.  El uso de medicamentos, como cremas, Clinical research associate, antibiticos o medicamentos para tratar infecciones por  hongos.  Baos de asiento. Si la irritacin est originada en una cicatriz del prepucio que impide una retraccin fcil, se recomienda una circuncisin. INSTRUCCIONES PARA EL CUIDADO EN EL HOGAR  Debe evitar las relaciones sexuales hasta que la afeccin se haya mejorado. ASEGRESE DE QUE:  Comprende estas instrucciones.  Controlar su afeccin.  Recibir ayuda de inmediato si no mejora o si empeora. Esta informacin no tiene Theme park manager el consejo del mdico. Asegrese de hacerle al mdico cualquier pregunta que tenga. Document Released: 12/21/2012 Document Revised: 04/25/2013 Document Reviewed: 10/10/2012 Elsevier Interactive Patient Education  [employer].   I personally performed the services described in this documentation, which was scribed in my presence. The recorded information has been reviewed and considered for accuracy and completeness, addended by me as needed, and agree with information above.  Signed,   Meredith Staggers, MD Primary Care at [facility].  08/14/16 6:59 PM

## 2016-08-14 NOTE — Patient Instructions (Addendum)
Apply to affected area twice per day for the next week to 10 days. Make sure to use that cream for at least 3-4 days after symptoms resolve. If not improving within the next 1 week, return for testing as discussed. Sooner if worse.   Balanitis (Balanitis) La balanitis es la inflamacin de la cabeza del pene (glande). CAUSAS Puede tener mltiples causas, tanto infecciosas como no infecciosas. Con frecuencia, la balanitis es el resultado de una higiene personal deficiente, especialmente en los hombres no circuncidados. Sin una higiene [place], los virus, las bacterias y los hongos se acumulan entre el prepucio y el glande. Esto puede ocasionar una infeccin. La falta de aire y la irritacin debido a la secrecin normal llamada esmegma contribuyen al problema en los hombres no circuncidados. Otras causas son:  Irritacin qumica por el uso de algunos jabones y geles de ducha (especialmente jabones con perfume), condones, lubricantes personales, vaselina, espermicidas y acondicionadores de telas.  Enfermedades de la piel, como el eczema, la dermatitis y la psoriasis.  Alergias a algunos frmacos, como tetraciclina y sulfas.  Algunas enfermedades como la cirrosis heptica, insuficiencia cardaca congestiva y enfermedades renales.  Obesidad mrbida. FACTORES DE RIESGO  Diabetes mellitus.  Un prepucio apretado que es difcil de tirar hacia atrs hasta pasar el glande (fimosis).  Tener relaciones sexuales sin usar un condn. [name] SNTOMAS Los sntomas pueden ser:  Secrecin que proviene de la zona debajo del prepucio.  Sensibilidad.  Picazn e imposibilidad para Landscape architect ereccin (debido al dolor).  Irritacin y erupcin cutnea.  Llagas en el glande y el prepucio. DIAGNSTICO El diagnstico de balanitis se realiza a travs de un examen fsico. TRATAMIENTO El tratamiento se basa en la causa. El tratamiento puede incluir:  Lavados frecuentes.  Mantener el glande y el  prepucio secos.  El uso de medicamentos, como cremas, Clinical research associate, antibiticos o medicamentos para tratar infecciones por hongos.  Baos de asiento. Si la irritacin est originada en una cicatriz del prepucio que impide una retraccin fcil, se recomienda una circuncisin. INSTRUCCIONES PARA EL CUIDADO EN EL HOGAR  Debe evitar las relaciones sexuales hasta que la afeccin se haya mejorado. ASEGRESE DE QUE:  Comprende estas instrucciones.  Controlar su afeccin.  Recibir ayuda de inmediato si no mejora o si empeora. Esta informacin no tiene Theme park manager el consejo del mdico. Asegrese de hacerle al mdico cualquier pregunta que tenga. Document Released: 12/21/2012 Document Revised: 04/25/2013 Document Reviewed: 10/10/2012 Elsevier Interactive Patient Education  2017 [employer].
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
## 2019-06-01 ENCOUNTER — Other Ambulatory Visit: Payer: Self-pay

## 2020-02-26 ENCOUNTER — Ambulatory Visit (INDEPENDENT_AMBULATORY_CARE_PROVIDER_SITE_OTHER): Payer: Self-pay | Admitting: Registered Nurse

## 2020-02-26 ENCOUNTER — Other Ambulatory Visit: Payer: Self-pay

## 2020-02-26 ENCOUNTER — Encounter: Payer: Self-pay | Admitting: Registered Nurse

## 2020-02-26 VITALS — BP 156/107 | HR 72 | Temp 98.2°F | Resp 18 | Ht 66.5 in | Wt 157.8 lb

## 2020-02-26 DIAGNOSIS — Z202 Contact with and (suspected) exposure to infections with a predominantly sexual mode of transmission: Secondary | ICD-10-CM

## 2020-02-26 MED ORDER — PENICILLIN G BENZATHINE 1200000 UNIT/2ML IM SUSP
2.4000 10*6.[IU] | Freq: Once | INTRAMUSCULAR | Status: AC
  Administered 2020-02-26: 2.4 10*6.[IU] via INTRAMUSCULAR

## 2020-02-26 NOTE — Patient Instructions (Signed)
° ° ° °  If you have lab work done today you will be contacted with your lab results within the next 2 weeks.  If you have not heard from us then please contact us. The fastest way to get your results is to register for My Chart. ° ° °IF you received an x-ray today, you will receive an invoice from [facility]. Please contact [facility] at [phone] with questions or concerns regarding your invoice.  ° °IF you received labwork today, you will receive an invoice from [facility]. Please contact [facility] at [phone] with questions or concerns regarding your invoice.  ° °Our billing staff will not be able to assist you with questions regarding bills from these companies. ° °You will be contacted with the lab results as soon as they are available. The fastest way to get your results is to activate your My Chart account. Instructions are located on the last page of this paperwork. If you have not heard from us regarding the results in 2 weeks, please contact this office. °  ° ° ° °

## 2020-02-26 NOTE — Progress Notes (Signed)
New Patient Office Visit  Subjective:  Patient ID: Christopher Dixon, male    DOB: [birth year]  Age: 29 y.o. MRN: [id number]  CC:  Chief Complaint  Patient presents with  . Exposure to STD    patient states his partner tested positive for an std and he would like to know if he could get tested and also discuss treatment.    HPI Christopher Dixon presents for std exposure  His partner tested positive for Syphilis. Other results negative He has been experiencing a few "acne spots" of some ulcerations around his body. A few headaches but not sure if this is from syphilis or stress/work. No other acute neurological symptoms. No other complaints or concerns  His partner is here today to help translate. They are both concerned about the spread of syphilis and how this may have happened. They do note that the partner has a good friend with whom she's shared razors and other hygiene products as well as cigarettes. Denies IV drug use. Unsure of this friend's status.  Past Medical History:  Diagnosis Date  . Bleeding nose    last one 6 months ago- controlled spont.   Marland Kitchen GERD (gastroesophageal reflux disease)    pt. reports some gastritis - no med. currently  . Headache(784.0)   . Seasonal allergies    pt. reports little throat irritation, sneezing on & off     Past Surgical History:  Procedure Laterality Date  . ANTERIOR CRUCIATE LIGAMENT REPAIR  09/25/2011   Procedure: RECONSTRUCTION ANTERIOR CRUCIATE LIGAMENT (ACL);  Surgeon: Verlee Rossetti, MD;  Location: [facility] OR;  Service: Orthopedics;  Laterality: Left;       Family History  Problem Relation Age of Onset  . Anesthesia problems Neg Hx     Social History   Socioeconomic History  . Marital status: Single    Spouse name: Not on file  . Number of children: Not on file  . Years of education: Not on file  . Highest education level: Not on file  Occupational History  . Not on file  Tobacco Use  . Smoking status: Former Smoker      Quit date: 05/18/2011    Years since quitting: 8.7  . Smokeless tobacco: Never Used  Substance and Sexual Activity  . Alcohol use: No  . Drug use: No  . Sexual activity: Never  Other Topics Concern  . Not on file  Social History Narrative  . Not on file   Social Determinants of Health   Financial Resource Strain:   . Difficulty of Paying Living Expenses: Not on file  Food Insecurity:   . Worried About Programme researcher, broadcasting/film/video in the Last Year: Not on file  . Ran Out of Food in the Last Year: Not on file  Transportation Needs:   . Lack of Transportation (Medical): Not on file  . Lack of Transportation (Non-Medical): Not on file  Physical Activity:   . Days of Exercise per Week: Not on file  . Minutes of Exercise per Session: Not on file  Stress:   . Feeling of Stress : Not on file  Social Connections:   . Frequency of Communication with Friends and Family: Not on file  . Frequency of Social Gatherings with Friends and Family: Not on file  . Attends Religious Services: Not on file  . Active Member of Clubs or Organizations: Not on file  . Attends Banker Meetings: Not on file  . Marital Status: Not on file  Intimate Partner Violence:   . Fear of Current or Ex-Partner: Not on file  . Emotionally Abused: Not on file  . Physically Abused: Not on file  . Sexually Abused: Not on file    ROS Review of Systems  Constitutional: Negative.   HENT: Negative.   Eyes: Negative.   Respiratory: Negative.   Cardiovascular: Negative.   Gastrointestinal: Negative.   Genitourinary: Negative.   Musculoskeletal: Negative.   Skin: Positive for rash. Negative for color change, pallor and wound.  Neurological: Positive for headaches. Negative for dizziness, tremors, seizures, syncope, facial asymmetry, speech difficulty, weakness, light-headedness and numbness.  Psychiatric/Behavioral: Negative.     Objective:   Today's Vitals: BP (!) 156/107   Pulse 72   Temp 98.2 F  (36.8 C) (Temporal)   Resp 18   Ht 5' 6.5" (1.689 m)   Wt 157 lb 12.8 oz (71.6 kg)   SpO2 97%   BMI 25.09 kg/m   Physical Exam Vitals and nursing note reviewed.  Constitutional:      Appearance: Normal appearance.  Cardiovascular:     Rate and Rhythm: Normal rate and regular rhythm.  Pulmonary:     Effort: Pulmonary effort is normal. No respiratory distress.  Skin:    General: Skin is warm and dry.     Coloration: Skin is not jaundiced or pale.     Findings: No bruising, erythema, lesion or rash.  Neurological:     General: No focal deficit present.     Mental Status: He is alert and oriented to person, place, and time. Mental status is at baseline.  Psychiatric:        Mood and Affect: Mood normal.        Behavior: Behavior normal.        Thought Content: Thought content normal.        Judgment: Judgment normal.     Assessment & Plan:   Problem List Items Addressed This Visit    None    Visit Diagnoses    STD exposure    -  Primary      Outpatient Encounter Medications as of 02/26/2020  Medication Sig  . clotrimazole (LOTRIMIN) 1 % cream Apply 1 application topically 2 (two) times daily. (Patient not taking: Reported on 02/26/2020)   No facility-administered encounter medications on file as of 02/26/2020.    Follow-up: No follow-ups on file.   PLAN  Do not feel that his mild occasional headaches are far enough from baseline to warrant concern for neurosyphilis  Will draw RPR titer today for monitoring  Treat patient and partner  Return if no improvement and return in 3 mo for follow up testing  Patient encouraged to call clinic with any questions, comments, or concerns.  Janeece Agee, NP

## 2020-02-27 LAB — RPR: RPR Ser Ql: NONREACTIVE

## 2020-02-27 NOTE — Progress Notes (Signed)
If we could call patient:   Syphilis result returns negative. This could mean that exposure happened within the past 3 months, as that is the duration of the incubation period for this bacteria.  It was still wise to treat him given his known exposure.  He can feel free to reach out with any questions  Thanks,  Jari Sportsman, NP

## 2020-04-29 ENCOUNTER — Ambulatory Visit: Payer: Self-pay | Admitting: Registered Nurse
